# Patient Record
Sex: Male | Born: 1995 | Race: Black or African American | Hispanic: No | Marital: Single | Smoking: Current every day smoker
Health system: Southern US, Community
[De-identification: ages and names within clinical notes are randomized; demographics above are authoritative.]

---
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
## 2015-01-03 ENCOUNTER — Encounter (HOSPITAL_COMMUNITY): Payer: Self-pay | Admitting: Emergency Medicine

## 2015-01-03 ENCOUNTER — Emergency Department (INDEPENDENT_AMBULATORY_CARE_PROVIDER_SITE_OTHER)
Admission: EM | Admit: 2015-01-03 | Discharge: 2015-01-03 | Disposition: A | Discharge status: Home / Self Care | Payer: Medicaid Other | Source: Home / Self Care | Attending: Family Medicine | Admitting: Family Medicine

## 2015-01-03 DIAGNOSIS — J039 Acute tonsillitis, unspecified: Secondary | ICD-10-CM | POA: Diagnosis not present

## 2015-01-03 MED ORDER — AMOXICILLIN 500 MG PO CAPS: 500.0000 mg | ORAL_CAPSULE | Freq: Three times a day (TID) | ORAL | Status: DC

## 2015-01-03 MED ORDER — ACETAMINOPHEN 325 MG PO TABS
ORAL_TABLET | ORAL | Status: AC
  Filled 2015-01-03: qty 2

## 2015-01-03 MED ORDER — ACETAMINOPHEN 325 MG PO TABS
650.0000 mg | ORAL_TABLET | Freq: Once | ORAL | Status: AC
  Administered 2015-01-03: 650 mg via ORAL

## 2015-01-03 NOTE — ED Provider Notes (Addendum)
CSN: [id number]     Arrival date & time 01/03/15  1526 History   First MD Initiated Contact with Patient 01/03/15 1538     Chief Complaint  Patient presents with  . Sore Throat   (Consider location/radiation/quality/duration/timing/severity/associated sxs/prior Treatment) Patient is a 19 y.o. male presenting with pharyngitis. The history is provided by the patient.  Sore Throat This is a new problem. The current episode started 2 days ago. The problem has been gradually worsening. The symptoms are aggravated by swallowing.    History reviewed. No pertinent past medical history. History reviewed. No pertinent past surgical history. No family history on file. History  Substance Use Topics  . Smoking status: Current Every Day Smoker  . Smokeless tobacco: Not on file  . Alcohol Use: No    Review of Systems  Constitutional: Negative.   HENT: Positive for sore throat.   Hematological: Positive for adenopathy.    Allergies  Review of patient's allergies indicates no known allergies.  Home Medications   Prior to Admission medications   Medication Sig Start Date End Date Taking? Authorizing Provider  amoxicillin (AMOXIL) 500 MG capsule Take 1 capsule (500 mg total) by mouth 3 (three) times daily. 01/03/15   Linna HoffJames D Karna Abed, MD   BP 125/74 mmHg  Pulse 96  Temp(Src) 102.4 F (39.1 C) (Oral)  Resp 18  SpO2 99% Physical Exam  Constitutional: He is oriented to person, place, and time. He appears well-developed and well-nourished. No distress.  HENT:  Head: Normocephalic.  Right Ear: External ear normal.  Left Ear: External ear normal.  Mouth/Throat: Oropharyngeal exudate present.  Neck: Normal range of motion. Neck supple.  Lymphadenopathy:    He has cervical adenopathy.  Neurological: He is alert and oriented to person, place, and time.  Skin: Skin is warm and dry. No rash noted.  Nursing note and vitals reviewed.   ED Course  Procedures (including critical care time) Labs  Review Labs Reviewed - No data to display  Imaging Review No results found.   MDM   1. Acute tonsillitis        Linna HoffJames D Amica Harron, MD 01/03/15 1544  Linna HoffJames D Angeliah Wisdom, MD 01/03/15 [phone]

## 2015-01-03 NOTE — ED Notes (Signed)
C/o sore throat, fever, very painful to swallow. Feels like strep throat

## 2015-09-15 ENCOUNTER — Encounter (HOSPITAL_COMMUNITY): Payer: Self-pay | Admitting: *Deleted

## 2015-09-15 ENCOUNTER — Emergency Department (INDEPENDENT_AMBULATORY_CARE_PROVIDER_SITE_OTHER)
Admission: EM | Admit: 2015-09-15 | Discharge: 2015-09-15 | Disposition: A | Discharge status: Home / Self Care | Payer: Medicaid Other | Source: Home / Self Care | Attending: Emergency Medicine | Admitting: Emergency Medicine

## 2015-09-15 ENCOUNTER — Emergency Department (INDEPENDENT_AMBULATORY_CARE_PROVIDER_SITE_OTHER): Payer: Medicaid Other

## 2015-09-15 DIAGNOSIS — S60221A Contusion of right hand, initial encounter: Secondary | ICD-10-CM

## 2015-09-15 DIAGNOSIS — M79644 Pain in right finger(s): Secondary | ICD-10-CM | POA: Diagnosis not present

## 2015-09-15 MED ORDER — IBUPROFEN 800 MG PO TABS: 800.0000 mg | ORAL_TABLET | Freq: Four times a day (QID) | ORAL | Status: DC | PRN

## 2015-09-15 MED ORDER — TRAMADOL HCL 50 MG PO TABS: ORAL_TABLET | ORAL | Status: DC

## 2015-09-15 NOTE — Discharge Instructions (Signed)
Go to www.goodrx.com to look up your medications. This will give you a list of where you can find your prescriptions at the most affordable prices.   This practice is taking new patients. They will see you even if you do not have insurance.  [facility] [address] [place], [place]  [address] [phone]  If you have no primary doctor, here are some resources that may be helpful:  Medicaid-accepting [facility] Providers: - [facility]- [address]  [phone];   - [facility]- [address] [place], [address] [phone]  - [facility]- [address], [address] [phone]  [facility]- [address] [phone]  - Renaye Rakers- [address] [phone]. Only accepts [place] patients after they have her name applied to their card  -Dr. Greggory Stallion Osei-Bonsu, [facility]. [address]    [place], [place] [address]  [phone]  Self Pay (no insurance) in [place]: - Sickle Cell Patients: Dr Willey Blade, [facility] [address] [place] [phone]  - [facility] [phone]  - Physician Referral Service- [phone]  - [facility]- [address], [place], [phone];  Monday to Friday, 9 a.m. - 7 p.m.; Saturday 9 a.m. to 1 p.m.  [facility]- [address] [place], [phone]  - [facility]- [address]      [phone] - [facility]- [address] [phone]  [facility], [address] [place]; [phone]; or [address], [place]; [phone].   [facility], [place]. [address], [place]; [phone]; Monday to Wednesday, 8:30 a.m. - 5 p.m.; Thursday, 8:30 a.m. - 8 p.m.  [facility] [place] [facility], [address], [place];  [phone]; Monday to Friday, 8 a.m. - 4:30 p.m.   [facility], [place] [address] [place], [phone]; first and third Saturday of the month, 9:30 a.m. - 12:30 p.m.  [facility], [address], [place], [phone]; second Saturday of the month, 9 a.m. -noon.  [facility] for children. For information, call [phone]; [phone]; or [phone].  Other agencies that provide inexpensive medical care:     [facility]  [phone]    [facility]  [phone]    [facility]  [phone] [address] [place] [address]    [facility]  [phone]    [facility]  [phone], [phone]; or [phone].  Chronic Pain Problems Contact [facility]  [phone] Patients need to be referred by their primary care doctor.  [facility]  [facility]     [employer]                          [facility] Dept. [address] [place]                       [address]      [address] [place] [address]   [phone] (After Hours)  General Information: Finding a doctor when you do not have health insurance can be tricky. Although you are not limited by an insurance plan, you  are of course limited by her finances and how much but he can pay out of pocket.  What are your options if you don't have health insurance?   1) Find a Librarian, academic and Pay Out of Pocket Although you won't have to find out who is covered by your insurance plan, it is a good idea to ask around and get recommendations. You will then need to call the office and see if the doctor you have chosen will accept you as a new patient and what types of options they offer for patients who are self-pay. Some doctors offer discounts or will set up payment plans for their patients who do not have insurance, but you will need to ask so you aren't surprised when you get to your appointment.  2) Contact Your  Local Health Department Not all health departments have doctors that can see patients for sick visits, but many do, so it is worth a call to see if yours does. If you don't know where your local health department is, you can check in your phone book. The CDC also has a tool to help you locate your state's health department, and many state websites also have listings of all of their local health departments.  3) Find a Walk-in Clinic If your illness is not likely to be very severe or complicated, you may want to try a walk in clinic. These are popping up all over the country in pharmacies, drugstores, and shopping centers. They're usually staffed by nurse practitioners or physician assistants that have been trained to treat common illnesses and complaints. They're usually fairly quick and inexpensive. However, if you have serious medical issues or chronic medical problems, these are probably not your best option

## 2015-09-15 NOTE — ED Notes (Signed)
Reports punching a wall with right fist 2 days ago.  C/O continued pain and swelling.  Pt has been using ice pack in waiting area.

## 2015-09-15 NOTE — ED Provider Notes (Signed)
HPI  SUBJECTIVE:  Andrew Ortiz is a right handed 20 y.o. male who presents with pain, swelling at the second right MCP after he punched a wall 2 days ago. He reports pain with moving his index finger. He tried applying traction and ice today without improvement. There are no alleviating factors. Symptoms are worse with palpation, moving his finger. No weakness, redness, numbness, tingling, deformity, other injury to the hand or wrist. No previous history of injury to his hand. Patient is a smoker. Past medical history negative for diabetes, hypertension.    History reviewed. No pertinent past medical history.  History reviewed. No pertinent past surgical history.  No family history on file.  Social History  Substance Use Topics  . Smoking status: Current Every Day Smoker  . Smokeless tobacco: None  . Alcohol Use: No    No current facility-administered medications for this encounter.  Current outpatient prescriptions:  .  ibuprofen (ADVIL,MOTRIN) 800 MG tablet, Take 1 tablet (800 mg total) by mouth every 6 (six) hours as needed., Disp: 30 tablet, Rfl: 0 .  traMADol (ULTRAM) 50 MG tablet, 1-2 tabs po q 6 hr prn pain Maximum dose= 8 tablets per day, Disp: 20 tablet, Rfl: 0  No Known Allergies   ROS  As noted in HPI.   Physical Exam  BP 154/89 mmHg  Pulse 71  Temp(Src) 98.8 F (37.1 C) (Oral)  SpO2 100%  Constitutional: Well developed, well nourished, no acute distress Eyes:  EOMI, conjunctiva normal bilaterally HENT: Normocephalic, atraumatic,mucus membranes moist Respiratory: Normal inspiratory effort Cardiovascular: Normal rate GI: nondistended skin: No rash, skin intact Musculoskeletal: Tenderness, swelling at the right second MCP. No tenderness along the metacarpals. Proximal, middle, distal phalanx normal. Baseline Strength and Sensation to R hand with normal light touch intact for Pt, CR< 2 secs.  Hand with intact motor strength 5/5 flexion / extension / FDP / FDS  against resistance in all fingers and 2-point discrimination intact at 5mm in index finger. PIP, DIP stable on varus/valgus stress.. Skin intact. No signs of trauma. Wrist WNL.  Neurologic: Alert & oriented x 3, no focal neuro deficits Psychiatric: Speech and behavior appropriate   ED Course   Medications - No data to display  Orders Placed This Encounter  Procedures  . DG Hand Complete Right    Standing Status: Standing     Number of Occurrences: 1     Standing Expiration Date:     Order Specific Question:  Reason for Exam (SYMPTOM  OR DIAGNOSIS REQUIRED)    Answer:  Blunt trauma; hand pain & swelling  . Apply finger splint static    Index finger.Long finger splint place under palm.    Standing Status: Standing     Number of Occurrences: 1     Standing Expiration Date:     Order Specific Question:  Laterality    Answer:  Right  . Maintain finger splint    Standing Status: Standing     Number of Occurrences: 1     Standing Expiration Date:     Order Specific Question:  Laterality    Answer:  Right    No results found for this or any previous visit (from the past 24 hour(s)). Dg Hand Complete Right  09/15/2015  CLINICAL DATA:  Blunt trauma with hand pain and swelling. Right hand pain after punching a wall a few days prior. EXAM: RIGHT HAND - COMPLETE 3+ VIEW COMPARISON:  None. FINDINGS: No fracture or dislocation. The alignment and joint  spaces are maintained. Particularly, the metacarpals are intact. Mild dorsal soft tissue edema, no radiopaque foreign body. IMPRESSION: Dorsal soft tissue edema.  No fracture or dislocation. Electronically Signed   By: Rubye Oaks M.D.   On: 09/15/2015 19:24     ED Clinical Impression  Hand contusion, right, initial encounter  ED Assessment/Plan  Reviewed imaging independently No fracture, dislocation. See radiology report for full details.  Presentation most consistent with hand contusion/sprain. No evidence of fracture.  We'll  place patient in a finger splint for comfort, home with ibuprofen, tramadol, follow-up with primary care physician of choice. Providing primary care referral list  Encouraged smoking cessation  Discussed imaging, MDM, plan and followup with patient . Discussed sn/sx that should prompt return to the UC. Patient  agrees with plan.   *This clinic note was created using Dragon dictation software. Therefore, there may be occasional mistakes despite careful proofreading.  ?   Domenick Gong, MD 09/15/15 [phone]

## 2018-06-08 ENCOUNTER — Ambulatory Visit (HOSPITAL_COMMUNITY)
Admission: EM | Admit: 2018-06-08 | Discharge: 2018-06-08 | Disposition: A | Discharge status: Home / Self Care | Payer: Self-pay | Attending: Family Medicine | Admitting: Family Medicine

## 2018-06-08 ENCOUNTER — Encounter (HOSPITAL_COMMUNITY): Payer: Self-pay

## 2018-06-08 DIAGNOSIS — Z7952 Long term (current) use of systemic steroids: Secondary | ICD-10-CM | POA: Insufficient documentation

## 2018-06-08 DIAGNOSIS — J039 Acute tonsillitis, unspecified: Secondary | ICD-10-CM | POA: Insufficient documentation

## 2018-06-08 DIAGNOSIS — Z79899 Other long term (current) drug therapy: Secondary | ICD-10-CM | POA: Insufficient documentation

## 2018-06-08 DIAGNOSIS — K122 Cellulitis and abscess of mouth: Secondary | ICD-10-CM | POA: Insufficient documentation

## 2018-06-08 DIAGNOSIS — F17200 Nicotine dependence, unspecified, uncomplicated: Secondary | ICD-10-CM | POA: Insufficient documentation

## 2018-06-08 LAB — POCT RAPID STREP A: Streptococcus, Group A Screen (Direct): NEGATIVE

## 2018-06-08 LAB — POCT INFECTIOUS MONO SCREEN: Mono Screen: NEGATIVE

## 2018-06-08 MED ORDER — PENICILLIN G BENZATHINE 1200000 UNIT/2ML IM SUSP
INTRAMUSCULAR | Status: AC
  Filled 2018-06-08: qty 2

## 2018-06-08 MED ORDER — PREDNISONE 50 MG PO TABS: 50.0000 mg | ORAL_TABLET | Freq: Every day | ORAL | 0 refills | Status: DC

## 2018-06-08 MED ORDER — PENICILLIN G BENZATHINE 1200000 UNIT/2ML IM SUSP
1.2000 10*6.[IU] | Freq: Once | INTRAMUSCULAR | Status: AC
  Administered 2018-06-08: 1.2 10*6.[IU] via INTRAMUSCULAR

## 2018-06-08 MED ORDER — METHYLPREDNISOLONE SODIUM SUCC 125 MG IJ SOLR
80.0000 mg | Freq: Once | INTRAMUSCULAR | Status: AC
  Administered 2018-06-08: 80 mg via INTRAMUSCULAR

## 2018-06-08 MED ORDER — METHYLPREDNISOLONE SODIUM SUCC 125 MG IJ SOLR
INTRAMUSCULAR | Status: AC
  Filled 2018-06-08: qty 2

## 2018-06-08 NOTE — ED Triage Notes (Signed)
Pt presents with sore throat. 

## 2018-06-08 NOTE — Discharge Instructions (Addendum)
Rapid strep negative. However, given your exam, will cover you empirically for bacterial infection with bicillin injection. A steroid injection is given in office today as well for swelling. As discussed, symptoms can still be due to viral illness/ drainage down your throat. This usually takes 7-10 days to resolve.  Start prednisone as directed. Monitor for any worsening of symptoms, swelling of the throat, trouble breathing, trouble swallowing, leaning forward to breath, drooling, go to the emergency department for further evaluation needed.

## 2018-06-08 NOTE — ED Provider Notes (Signed)
[facility]    CSN: [id number] Arrival date & time: 06/08/18  1714     History   Chief Complaint Chief Complaint  Patient presents with  . Sore Throat    HPI Andrew Ortiz is a 22 y.o. male.   22 year old male comes in for 1 day history of sore throat.  Denies rhinorrhea, nasal congestion, cough.  Denies fever, chills, night sweats.  He has had muffled voice since this morning as well.  Can handle secretions on own, but has been spitting to prevent worsening of sore throat.  Denies tripoding, drooling, trismus.  No obvious sick contact.  Has not tried anything for the symptoms.     History reviewed. No pertinent past medical history.  There are no active problems to display for this patient.   History reviewed. No pertinent surgical history.     Home Medications    Prior to Admission medications   Medication Sig Start Date End Date Taking? Authorizing Provider  ibuprofen (ADVIL,MOTRIN) 800 MG tablet Take 1 tablet (800 mg total) by mouth every 6 (six) hours as needed. 09/15/15   Domenick GongMortenson, Ashley, MD  predniSONE (DELTASONE) 50 MG tablet Take 1 tablet (50 mg total) by mouth daily. 06/08/18   Belinda FisherYu,  V, PA-C  traMADol (ULTRAM) 50 MG tablet 1-2 tabs po q 6 hr prn pain Maximum dose= 8 tablets per day 09/15/15   Domenick GongMortenson, Ashley, MD    Family History History reviewed. No pertinent family history.  Social History Social History   Tobacco Use  . Smoking status: Current Every Day Smoker  Substance Use Topics  . Alcohol use: No  . Drug use: No     Allergies   Patient has no known allergies.   Review of Systems Review of Systems  Reason unable to perform ROS: See HPI as above.     Physical Exam Triage Vital Signs ED Triage Vitals  Enc Vitals Group     BP 06/08/18 1742 (!) 144/86     Pulse Rate 06/08/18 1742 95     Resp 06/08/18 1742 20     Temp 06/08/18 1742 99.9 F (37.7 C)     Temp Source 06/08/18 1742 Oral     SpO2 06/08/18 1742 100 %    Weight --      Height --      Head Circumference --      Peak Flow --      Pain Score 06/08/18 1743 8     Pain Loc --      Pain Edu? --      Excl. in GC? --    No data found.  Updated Vital Signs BP (!) 144/86 (BP Location: Left Arm)   Pulse 95   Temp 99.9 F (37.7 C) (Oral)   Resp 20   SpO2 100%   Visual Acuity Right Eye Distance:   Left Eye Distance:   Bilateral Distance:    Right Eye Near:   Left Eye Near:    Bilateral Near:     Physical Exam  Constitutional: He is oriented to person, place, and time. He appears well-developed and well-nourished. No distress.  HENT:  Head: Normocephalic and atraumatic.  Right Ear: Tympanic membrane, external ear and ear canal normal. Tympanic membrane is not erythematous and not bulging.  Left Ear: Tympanic membrane, external ear and ear canal normal. Tympanic membrane is not erythematous and not bulging.  Nose: Nose normal. Right sinus exhibits no maxillary sinus tenderness and no frontal sinus  tenderness. Left sinus exhibits no maxillary sinus tenderness and no frontal sinus tenderness.  Mouth/Throat: Uvula is midline, oropharynx is clear and moist and mucous membranes are normal.  Patient handling own secretions.  Muffled voice when talking. " Kissing" tonsils with exudates, equal bilaterally.  Uvula swelling.  Eyes: Pupils are equal, round, and reactive to light. Conjunctivae are normal.  Neck: Normal range of motion. Neck supple.  Cardiovascular: Normal rate, regular rhythm and normal heart sounds. Exam reveals no gallop and no friction rub.  No murmur heard. Pulmonary/Chest: Effort normal and breath sounds normal. He has no decreased breath sounds. He has no wheezes. He has no rhonchi. He has no rales.  Lymphadenopathy:    He has no cervical adenopathy.  Neurological: He is alert and oriented to person, place, and time.  Skin: Skin is warm and dry.  Psychiatric: He has a normal mood and affect. His behavior is normal. Judgment  normal.     UC Treatments / Results  Labs (all labs ordered are listed, but only abnormal results are displayed) Labs Reviewed  CULTURE, GROUP A STREP [facility])  POCT RAPID STREP A  POCT INFECTIOUS MONO SCREEN    EKG None  Radiology No results found.  Procedures Procedures (including critical care time)  Medications Ordered in UC Medications  penicillin g benzathine (BICILLIN LA) 1200000 UNIT/2ML injection 1.2 Million Units (has no administration in time range)  methylPREDNISolone sodium succinate (SOLU-MEDROL) 125 mg/2 mL injection 80 mg (has no administration in time range)    Initial Impression / Assessment and Plan / UC Course  I have reviewed the triage vital signs and the nursing notes.  Pertinent labs & imaging results that were available during my care of the patient were reviewed by me and considered in my medical decision making (see chart for details).    Rapid strep negative.  Mono negative.  Given patient's history and exam, will treat empirically for tonsillitis.  Patient prefers injection given painful swallowing.  Bicillin injection in office.  Given extensive swelling, "kissing" tonsils, will provide prednisone.  Solu-Medrol injection in office, prednisone as directed.  Strict return precautions given.  Patient expresses understanding and agrees to plan.  Case discussed with Dr. Delton See, who agrees to plan.  Final Clinical Impressions(s) / UC Diagnoses   Final diagnoses:  Tonsillitis  Uvulitis    ED Prescriptions    Medication Sig Dispense Auth. Provider   predniSONE (DELTASONE) 50 MG tablet Take 1 tablet (50 mg total) by mouth daily. 5 tablet Threasa Alpha, [place] 06/08/18 1904

## 2018-06-11 LAB — CULTURE, GROUP A STREP (THRC)

## 2020-03-09 ENCOUNTER — Other Ambulatory Visit: Payer: Self-pay

## 2020-03-09 ENCOUNTER — Ambulatory Visit (HOSPITAL_COMMUNITY): Admission: EM | Admit: 2020-03-09 | Discharge: 2020-03-09 | Discharge status: Against Medical Advice | Payer: Self-pay

## 2020-08-07 ENCOUNTER — Emergency Department (HOSPITAL_COMMUNITY)
Admission: EM | Admit: 2020-08-07 | Discharge: 2020-08-07 | Disposition: A | Discharge status: Home / Self Care | Payer: HRSA Program | Attending: Emergency Medicine | Admitting: Emergency Medicine

## 2020-08-07 ENCOUNTER — Encounter (HOSPITAL_COMMUNITY): Payer: Self-pay

## 2020-08-07 ENCOUNTER — Other Ambulatory Visit: Payer: Self-pay

## 2020-08-07 DIAGNOSIS — U071 COVID-19: Secondary | ICD-10-CM | POA: Insufficient documentation

## 2020-08-07 DIAGNOSIS — F17200 Nicotine dependence, unspecified, uncomplicated: Secondary | ICD-10-CM | POA: Diagnosis not present

## 2020-08-07 DIAGNOSIS — R509 Fever, unspecified: Secondary | ICD-10-CM | POA: Diagnosis present

## 2020-08-07 DIAGNOSIS — B349 Viral infection, unspecified: Secondary | ICD-10-CM

## 2020-08-07 MED ORDER — ACETAMINOPHEN 500 MG PO TABS
1000.0000 mg | ORAL_TABLET | Freq: Once | ORAL | Status: AC
  Administered 2020-08-07: 1000 mg via ORAL

## 2020-08-07 NOTE — Discharge Instructions (Addendum)
It was our pleasure to provide your ER care today - we hope that you feel better. ° °Your covid results are pending, and should be resulted in the next 6-12 hours - you may check MyChart, or call tomorrow, for results.  ° °Stay well hydrated and get adequate nutrition. Stay active, take full and deep breaths.  ° °Take acetaminophen or ibuprofen as need.  ° °Return to ER if worse, new symptoms, increased trouble breathing, persistent vomiting, weak/fainting, chest pain, or other concern.  °

## 2020-08-07 NOTE — ED Provider Notes (Signed)
[facility]-EMERGENCY DEPT Provider Note   CSN: [id number] Arrival date & time: 08/07/20  1529     History Chief Complaint  Patient presents with  . Generalized Body Aches  . Chills    Andrew Ortiz is a 25 y.o. male.  Patient c/o body aches, subjective fever, in the past couple days. Symptoms acute onset, moderate, constant, persistent. States has been around others w uri symptoms, unsure if specific covid exposure. Has not been vaccinated. Denies chest pain or discomfort. No sob. No abd pain or nvd. No change in taste or smell. No sore throat or trouble breathing, or swallowing. Mild intermittent frontal headache, no abrupt or severe head pain. No neck pain or stiffness. No rash.  The history is provided by the patient.       History reviewed. No pertinent past medical history.  There are no problems to display for this patient.   History reviewed. No pertinent surgical history.     History reviewed. No pertinent family history.  Social History   Tobacco Use  . Smoking status: Current Every Day Smoker  Substance Use Topics  . Alcohol use: No  . Drug use: No    Home Medications Prior to Admission medications   Medication Sig Start Date End Date Taking? Authorizing Provider  ibuprofen (ADVIL,MOTRIN) 800 MG tablet Take 1 tablet (800 mg total) by mouth every 6 (six) hours as needed. 09/15/15   Domenick Gong, MD  predniSONE (DELTASONE) 50 MG tablet Take 1 tablet (50 mg total) by mouth daily. 06/08/18   Belinda Fisher, PA-C  traMADol (ULTRAM) 50 MG tablet 1-2 tabs po q 6 hr prn pain Maximum dose= 8 tablets per day 09/15/15   Domenick Gong, MD    Allergies    Patient has no known allergies.  Review of Systems   Review of Systems  Constitutional: Positive for fever.  HENT: Negative for sore throat.   Eyes: Negative for redness.  Respiratory: Negative for cough and shortness of breath.   Cardiovascular: Negative for chest pain.   Gastrointestinal: Negative for abdominal pain, diarrhea and vomiting.  Genitourinary: Negative for dysuria and flank pain.  Musculoskeletal: Positive for myalgias. Negative for neck pain and neck stiffness.  Skin: Negative for rash.  Neurological: Positive for headaches. Negative for weakness and numbness.  Hematological: Does not bruise/bleed easily.  Psychiatric/Behavioral: Negative for confusion.    Physical Exam Updated Vital Signs BP 122/73 (BP Location: Left Arm)   Pulse 92   Temp 100.3 F (37.9 C) (Oral)   Resp 18   Ht 1.88 m (6\' 2" )   Wt 86.2 kg   SpO2 97%   BMI 24.39 kg/m   Physical Exam Vitals and nursing note reviewed.  Constitutional:      Appearance: Normal appearance. He is well-developed.  HENT:     Head: Atraumatic.     Nose: Nose normal.     Mouth/Throat:     Mouth: Mucous membranes are moist.     Pharynx: Oropharynx is clear. No oropharyngeal exudate or posterior oropharyngeal erythema.  Eyes:     General: No scleral icterus.    Conjunctiva/sclera: Conjunctivae normal.  Neck:     Trachea: No tracheal deviation.  Cardiovascular:     Rate and Rhythm: Normal rate and regular rhythm.     Pulses: Normal pulses.     Heart sounds: Normal heart sounds. No murmur heard. No friction rub. No gallop.   Pulmonary:     Effort: Pulmonary effort is normal.  No accessory muscle usage or respiratory distress.     Breath sounds: Normal breath sounds.  Abdominal:     General: There is no distension.     Palpations: Abdomen is soft.     Tenderness: There is no abdominal tenderness.  Genitourinary:    Comments: No cva tenderness. Musculoskeletal:        General: No swelling.     Cervical back: Normal range of motion and neck supple. No rigidity.     Right lower leg: No edema.     Left lower leg: No edema.  Lymphadenopathy:     Cervical: No cervical adenopathy.  Skin:    General: Skin is warm and dry.     Findings: No rash.  Neurological:     Mental Status:  He is alert.     Comments: Alert, speech clear. Motor/sens grossly intact bil. Steady gait.   Psychiatric:        Mood and Affect: Mood normal.     ED Results / Procedures / Treatments   Labs (all labs ordered are listed, but only abnormal results are displayed) Labs Reviewed  SARS CORONAVIRUS 2 (TAT 6-24 HRS)    EKG None  Radiology No results found.  Procedures Procedures (including critical care time)  Medications Ordered in ED Medications  acetaminophen (TYLENOL) tablet 1,000 mg (has no administration in time range)    ED Course  I have reviewed the triage vital signs and the nursing notes.  Pertinent labs & imaging results that were available during my care of the patient were reviewed by me and considered in my medical decision making (see chart for details).    MDM Rules/Calculators/A&P                         COvid swab sent.   Reviewed nursing notes and prior charts for additional history.   Andrew Ortiz was evaluated in Emergency Department on 08/07/2020 for the symptoms described in the history of present illness. He was evaluated in the context of the global COVID-19 pandemic, which necessitated consideration that the patient might be at risk for infection with the SARS-CoV-2 virus that causes COVID-19. Institutional protocols and algorithms that pertain to the evaluation of patients at risk for COVID-19 are in a state of rapid change based on information released by regulatory bodies including the CDC and federal and state organizations. These policies and algorithms were followed during the patient's care in the ED.  Acetaminophen po. Po fluids.  Pts symptoms/exam felt most c/w viral illness, possibly covid. Swab results pending.  Pulse ox is 97%, and pt with normal work of breathing, and non-toxic appearing - pt currently appears stable for d/c.   Return precautions provided.    Final Clinical Impression(s) / ED Diagnoses Final diagnoses:  None     Rx / DC Orders ED Discharge Orders    None       Cathren Laine, MD 08/07/20 2050

## 2020-08-07 NOTE — ED Triage Notes (Signed)
Pt presents with c/o generalized body aches and chills. Pt denies any known covid exposures.

## 2020-08-08 LAB — SARS CORONAVIRUS 2 (TAT 6-24 HRS): SARS Coronavirus 2: POSITIVE — AB

## 2020-11-24 ENCOUNTER — Emergency Department (HOSPITAL_COMMUNITY): Payer: No Typology Code available for payment source

## 2020-11-24 ENCOUNTER — Other Ambulatory Visit: Payer: Self-pay

## 2020-11-24 ENCOUNTER — Emergency Department (HOSPITAL_COMMUNITY)
Admission: EM | Admit: 2020-11-24 | Discharge: 2020-11-24 | Disposition: A | Discharge status: Home / Self Care | Payer: No Typology Code available for payment source | Attending: Emergency Medicine | Admitting: Emergency Medicine

## 2020-11-24 DIAGNOSIS — F17200 Nicotine dependence, unspecified, uncomplicated: Secondary | ICD-10-CM | POA: Diagnosis not present

## 2020-11-24 DIAGNOSIS — Y92410 Unspecified street and highway as the place of occurrence of the external cause: Secondary | ICD-10-CM | POA: Insufficient documentation

## 2020-11-24 DIAGNOSIS — M545 Low back pain, unspecified: Secondary | ICD-10-CM | POA: Diagnosis present

## 2020-11-24 MED ORDER — IBUPROFEN 800 MG PO TABS
800.0000 mg | ORAL_TABLET | Freq: Once | ORAL | Status: AC
  Administered 2020-11-24: 800 mg via ORAL
  Filled 2020-11-24: qty 1

## 2020-11-24 MED ORDER — NAPROXEN 500 MG PO TABS: 500.0000 mg | ORAL_TABLET | Freq: Two times a day (BID) | ORAL | 0 refills | Status: DC

## 2020-11-24 MED ORDER — METHOCARBAMOL 500 MG PO TABS: 500.0000 mg | ORAL_TABLET | Freq: Two times a day (BID) | ORAL | 0 refills | Status: DC
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
## 2022-05-03 ENCOUNTER — Ambulatory Visit (HOSPITAL_COMMUNITY)
Admission: EM | Admit: 2022-05-03 | Discharge: 2022-05-03 | Disposition: A | Discharge status: Home / Self Care | Payer: Self-pay | Attending: Internal Medicine | Admitting: Internal Medicine

## 2022-05-03 ENCOUNTER — Encounter (HOSPITAL_COMMUNITY): Payer: Self-pay

## 2022-05-03 DIAGNOSIS — Z8709 Personal history of other diseases of the respiratory system: Secondary | ICD-10-CM

## 2022-05-03 DIAGNOSIS — J029 Acute pharyngitis, unspecified: Secondary | ICD-10-CM

## 2022-05-03 DIAGNOSIS — J358 Other chronic diseases of tonsils and adenoids: Secondary | ICD-10-CM

## 2022-05-03 DIAGNOSIS — R509 Fever, unspecified: Secondary | ICD-10-CM

## 2022-05-03 LAB — POCT INFECTIOUS MONO SCREEN, ED / UC: Mono Screen: NEGATIVE

## 2022-05-03 LAB — POCT RAPID STREP A, ED / UC: Streptococcus, Group A Screen (Direct): NEGATIVE

## 2022-05-03 MED ORDER — AMOXICILLIN 500 MG PO CAPS: 500.0000 mg | ORAL_CAPSULE | Freq: Two times a day (BID) | ORAL | 0 refills | Status: DC

## 2022-05-03 NOTE — Discharge Instructions (Addendum)
Rapid strep throat test is negative, throat culture is pending.  The mononucleosis screen is negative today.  I am highly suspicious that you have strep throat.  Please take the amoxicillin as prescribed to treat it.  Continue salt water gargles, start Chloraseptic spray or Cepacol lozenges to help with pain.  If symptoms persist or worsen despite treatment, follow-up.

## 2022-05-03 NOTE — ED Triage Notes (Signed)
Pt is here for sore throat with painful swallowing x3days

## 2022-05-03 NOTE — ED Provider Notes (Signed)
[facility]    CSN: [id number] Arrival date & time: 05/03/22  1643      History   Chief Complaint Chief Complaint  Patient presents with   Sore Throat    HPI Andrew Ortiz is a 26 y.o. male.   Patient presents for 3 days of sore throat that has consistently worsened.  He also endorses tactile fever, body aches, sweats and chills at home.  Endorses a slight cough, headache and swollen glands on his neck.  Also reports decreased appetite because it hurts to swallow and fatigue.  Denies known sick contacts.  No chest pain, shortness of breath, chest tightness or chest congestion.  No runny nose, nasal congestion, abdominal pain, nausea/vomiting, diarrhea, or new rash.  He has tried warm salt water gargles which helps with the sore throat temporarily.    History reviewed. No pertinent past medical history.  There are no problems to display for this patient.   History reviewed. No pertinent surgical history.     Home Medications    Prior to Admission medications   Medication Sig Start Date End Date Taking? Authorizing Provider  amoxicillin (AMOXIL) 500 MG capsule Take 1 capsule (500 mg total) by mouth 2 (two) times daily. 05/03/22  Yes Eulogio Bear, NP  ibuprofen (ADVIL,MOTRIN) 800 MG tablet Take 1 tablet (800 mg total) by mouth every 6 (six) hours as needed. 09/15/15   Melynda Ripple, MD  methocarbamol (ROBAXIN) 500 MG tablet Take 1 tablet (500 mg total) by mouth 2 (two) times daily. 11/24/20   Suzy Bouchard, PA-C  naproxen (NAPROSYN) 500 MG tablet Take 1 tablet (500 mg total) by mouth 2 (two) times daily. 11/24/20   Suzy Bouchard, PA-C  predniSONE (DELTASONE) 50 MG tablet Take 1 tablet (50 mg total) by mouth daily. 06/08/18   Ok Edwards, PA-C  traMADol (ULTRAM) 50 MG tablet 1-2 tabs po q 6 hr prn pain Maximum dose= 8 tablets per day 09/15/15   Melynda Ripple, MD    Family History History reviewed. No pertinent family history.  Social  History Social History   Tobacco Use   Smoking status: Every Day  Substance Use Topics   Alcohol use: No   Drug use: No     Allergies   Patient has no known allergies.   Review of Systems Review of Systems Per HPI  Physical Exam Triage Vital Signs ED Triage Vitals [05/03/22 1703]  Enc Vitals Group     BP (!) 146/93     Pulse Rate 79     Resp 12     Temp 99.7 F (37.6 C)     Temp Source Oral     SpO2 98 %     Weight      Height      Head Circumference      Peak Flow      Pain Score 7     Pain Loc      Pain Edu?      Excl. in [place]?    No data found.  Updated Vital Signs BP (!) 146/93 (BP Location: Right Arm)   Pulse 79   Temp 99.7 F (37.6 C) (Oral)   Resp 12   SpO2 98%   Visual Acuity Right Eye Distance:   Left Eye Distance:   Bilateral Distance:    Right Eye Near:   Left Eye Near:    Bilateral Near:     Physical Exam Vitals and nursing note reviewed.  Constitutional:  General: He is not in acute distress.    Appearance: He is well-developed. He is not ill-appearing, toxic-appearing or diaphoretic.  HENT:     Head: Normocephalic and atraumatic.     Right Ear: Tympanic membrane and ear canal normal. No drainage, swelling or tenderness. No middle ear effusion. Tympanic membrane is not erythematous.     Left Ear: Tympanic membrane and ear canal normal. No drainage, swelling or tenderness.  No middle ear effusion. Tympanic membrane is not erythematous.     Nose: No congestion or rhinorrhea.     Mouth/Throat:     Mouth: Mucous membranes are dry.     Pharynx: Posterior oropharyngeal erythema present. No uvula swelling.     Tonsils: Tonsillar exudate present. No tonsillar abscesses. 3+ on the right. 3+ on the left.     Comments: Petechiae posterior palate Eyes:     Conjunctiva/sclera: Conjunctivae normal.  Cardiovascular:     Rate and Rhythm: Normal rate and regular rhythm.  Pulmonary:     Effort: Pulmonary effort is normal. No respiratory  distress.     Breath sounds: Normal breath sounds. No wheezing, rhonchi or rales.  Musculoskeletal:     Cervical back: Neck supple.  Lymphadenopathy:     Cervical: No cervical adenopathy.  Skin:    General: Skin is warm and dry.     Coloration: Skin is not pale.     Findings: No erythema or rash.  Neurological:     Mental Status: He is alert and oriented to person, place, and time.      UC Treatments / Results  Labs (all labs ordered are listed, but only abnormal results are displayed) Labs Reviewed  CULTURE, GROUP A STREP [facility])  POCT RAPID STREP A, ED / UC  POCT INFECTIOUS MONO SCREEN, ED / UC    EKG   Radiology No results found.  Procedures Procedures (including critical care time)  Medications Ordered in UC Medications - No data to display  Initial Impression / Assessment and Plan / UC Course  I have reviewed the triage vital signs and the nursing notes.  Pertinent labs & imaging results that were available during my care of the patient were reviewed by me and considered in my medical decision making (see chart for details).    Patient is well-appearing, normotensive, afebrile, not tachycardic, not tachypneic, oxygenating well on room air.  Rapid strep throat test negative, mononucleosis screen negative.  Throat culture pending.  I am highly suspicious for strep throat given examination and history of strep throat.  We will treat empirically with amoxicillin twice daily for 10 days.  Toothbrush after starting treatment.  Supportive care discussed.  ER and return precautions discussed.  Note given for work.  The patient was given the opportunity to ask questions.  All questions answered to their satisfaction.  The patient is in agreement to this plan.    Final Clinical Impressions(s) / UC Diagnoses   Final diagnoses:  Acute pharyngitis, unspecified etiology  Tonsillar exudate  History of strep pharyngitis  Fever, unspecified     Discharge Instructions       Rapid strep throat test is negative, throat culture is pending.  The mononucleosis screen is negative today.  I am highly suspicious that you have strep throat.  Please take the amoxicillin as prescribed to treat it.  Continue salt water gargles, start Chloraseptic spray or Cepacol lozenges to help with pain.  If symptoms persist or worsen despite treatment, follow-up.     ED  Prescriptions     Medication Sig Dispense Auth. Provider   amoxicillin (AMOXIL) 500 MG capsule Take 1 capsule (500 mg total) by mouth 2 (two) times daily. 20 capsule Eulogio Bear, NP      PDMP not reviewed this encounter.   Eulogio Bear, NP 05/03/22 1757

## 2022-05-06 LAB — CULTURE, GROUP A STREP (THRC)

## 2022-06-13 IMAGING — CR DG THORACIC SPINE 2V
2 series · 2 of 2 positions shown · non-contrast
Comparison: None.

CLINICAL DATA: Pain

EXAM:
THORACIC SPINE 2 VIEWS; LUMBAR SPINE - COMPLETE 4+ VIEW

[t thoracic spine ap]
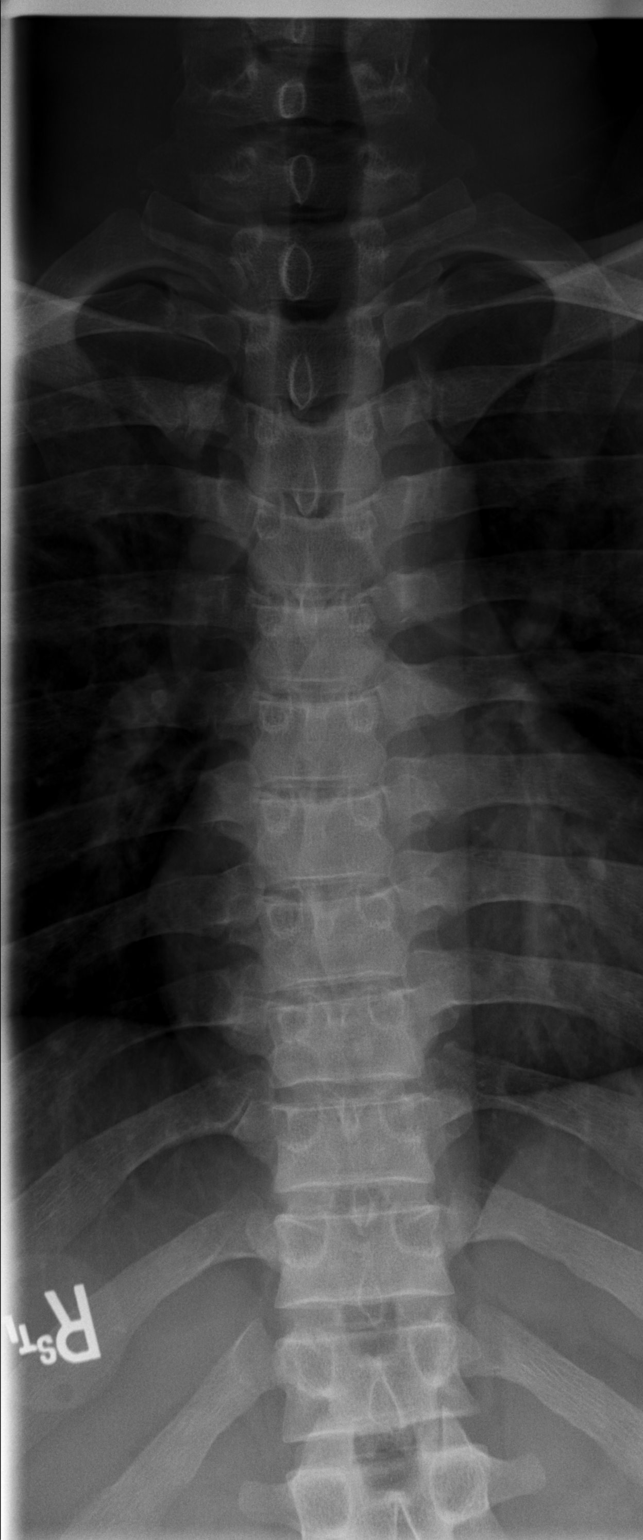

[t thoracic spine lat]
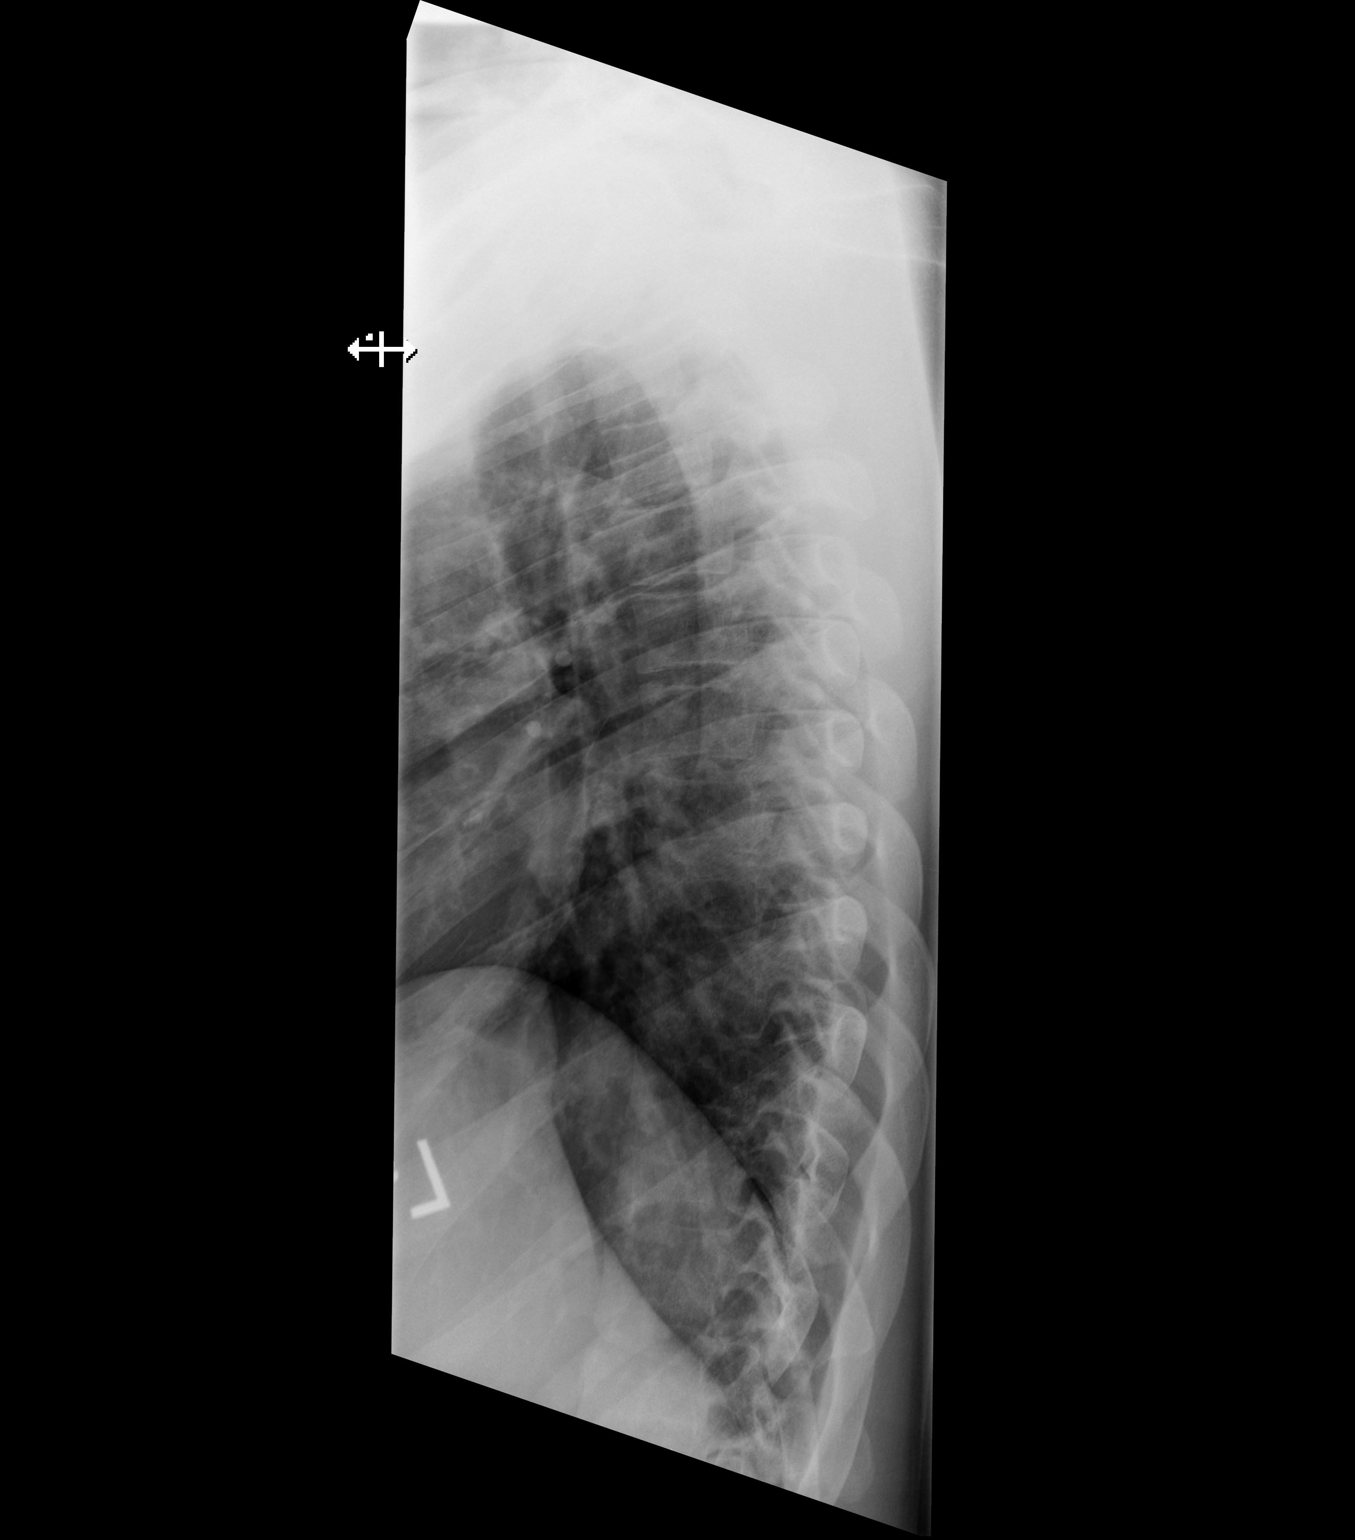

[2 of 2 positions shown; findings below may reference images not displayed]

FINDINGS: There is no acute osseous abnormality involving the lumbar spine.
There are no significant degenerative changes. There is no acute
osseous abnormality involving the thoracic spine, however evaluation
is significantly limited by suboptimal lateral view.
IMPRESSION: Negative.

## 2024-01-30 ENCOUNTER — Encounter (HOSPITAL_COMMUNITY): Payer: Self-pay

## 2024-01-30 ENCOUNTER — Ambulatory Visit (HOSPITAL_COMMUNITY)
Admission: EM | Admit: 2024-01-30 | Discharge: 2024-01-30 | Disposition: A | Discharge status: Home / Self Care | Payer: Self-pay | Attending: Internal Medicine | Admitting: Internal Medicine

## 2024-01-30 ENCOUNTER — Ambulatory Visit (INDEPENDENT_AMBULATORY_CARE_PROVIDER_SITE_OTHER): Payer: Self-pay

## 2024-01-30 ENCOUNTER — Other Ambulatory Visit: Payer: Self-pay

## 2024-01-30 DIAGNOSIS — M79675 Pain in left toe(s): Secondary | ICD-10-CM

## 2024-01-30 DIAGNOSIS — M25476 Effusion, unspecified foot: Secondary | ICD-10-CM

## 2024-01-30 MED ORDER — PREDNISONE 20 MG PO TABS: 40.0000 mg | ORAL_TABLET | Freq: Every day | ORAL | 0 refills | Status: AC

## 2024-01-30 NOTE — Discharge Instructions (Addendum)
 Your x-ray of your left great toe appears normal. I have drawn blood work today to evaluate for other causes of pain and swelling of your left great toe. Nurse will call you if your blood work is abnormal.  Take prednisone  40 mg (2 pills) once daily for the next 5 days.  Do not take any ibuprofen /naproxen  while you are taking prednisone .  Rest, ice, and elevate the left great toe for support.  Please schedule a follow-up appointment with the podiatrist (foot doctor) listed on your paperwork.  If you develop any new or worsening symptoms or if your symptoms do not start to improve, please return here or follow-up with your primary care provider. If your symptoms are severe, please go to the emergency room.

## 2024-01-30 NOTE — ED Provider Notes (Signed)
 [facility]    CSN: [id number] Arrival date & time: 01/30/24  1420      History   Chief Complaint Chief Complaint  Patient presents with   Toe Pain    HPI Andrew Ortiz is a 28 y.o. male.   Andrew Ortiz is a 28 y.o. male presenting for chief complaint of left great toe pain and swelling that started approximately 1 week ago.  Pain and swelling has worsened significantly over the last 7 days.  He works at [employer] where he is jumping and on his feet the majority of the day, he wonders if this is triggered the pain and swelling.  No recent trauma or injuries to the left great toe that he is aware of.  No numbness or tingling distally to pain/swelling, history of previous injury to the left great toe, history of gout, or recent fever/chills.  He has not noticed any rash, redness, or drainage from the toe.  Toenail is intact.  He does eat fish on a regular basis as well as red meat.  No family history of gout.  He has not attempted use of any over-the-counter medications to help with symptoms PTA.  Upon further questioning and at discharge, patient tells me that he has been forced to wear shoes that are too small for him as his boss ordered him a size 10 shoe instead of a size 11 shoe.  He has been cramping his feet into the shoes and states this has worsened his toe pain and swelling while attempting to perform his job activities.     History reviewed. No pertinent past medical history.  There are no active problems to display for this patient.   History reviewed. No pertinent surgical history.     Home Medications    Prior to Admission medications   Medication Sig Start Date End Date Taking? Authorizing Provider  predniSONE  (DELTASONE ) 20 MG tablet Take 2 tablets (40 mg total) by mouth daily with breakfast for 5 days. 01/30/24 02/04/24 Yes StanhopeDorna HERO, FNP  amoxicillin  (AMOXIL ) 500 MG capsule Take 1 capsule (500 mg total) by mouth 2 (two) times daily.  05/03/22   Chandra Harlene LABOR, NP  ibuprofen  (ADVIL ,MOTRIN ) 800 MG tablet Take 1 tablet (800 mg total) by mouth every 6 (six) hours as needed. 09/15/15   Van Knee, MD  methocarbamol  (ROBAXIN ) 500 MG tablet Take 1 tablet (500 mg total) by mouth 2 (two) times daily. 11/24/20   Aberman, Caroline C, PA-C  naproxen  (NAPROSYN ) 500 MG tablet Take 1 tablet (500 mg total) by mouth 2 (two) times daily. 11/24/20   Aberman, Caroline C, PA-C  traMADol  (ULTRAM ) 50 MG tablet 1-2 tabs po q 6 hr prn pain Maximum dose= 8 tablets per day 09/15/15   Van Knee, MD    Family History History reviewed. No pertinent family history.  Social History Social History   Tobacco Use   Smoking status: Every Day  Vaping Use   Vaping status: Never Used  Substance Use Topics   Alcohol use: No   Drug use: No     Allergies   Patient has no known allergies.   Review of Systems Review of Systems Per HPI  Physical Exam Triage Vital Signs ED Triage Vitals  Encounter Vitals Group     BP 01/30/24 1455 124/80     Girls Systolic BP Percentile --      Girls Diastolic BP Percentile --      Boys Systolic BP  Percentile --      Boys Diastolic BP Percentile --      Pulse Rate 01/30/24 1455 97     Resp 01/30/24 1455 20     Temp 01/30/24 1455 99 F (37.2 C)     Temp Source 01/30/24 1455 Oral     SpO2 01/30/24 1455 98 %     Weight --      Height --      Head Circumference --      Peak Flow --      Pain Score 01/30/24 1452 8     Pain Loc --      Pain Education --      Exclude from Growth Chart --    No data found.  Updated Vital Signs BP 124/80 (BP Location: Right Arm)   Pulse 97   Temp 99 F (37.2 C) (Oral)   Resp 20   SpO2 98%   Visual Acuity Right Eye Distance:   Left Eye Distance:   Bilateral Distance:    Right Eye Near:   Left Eye Near:    Bilateral Near:     Physical Exam Vitals and nursing note reviewed.  Constitutional:      Appearance: He is not ill-appearing or  toxic-appearing.  HENT:     Head: Normocephalic and atraumatic.     Right Ear: Hearing and external ear normal.     Left Ear: Hearing and external ear normal.     Nose: Nose normal.     Mouth/Throat:     Lips: Pink.  Eyes:     General: Lids are normal. Vision grossly intact. Gaze aligned appropriately.     Extraocular Movements: Extraocular movements intact.     Conjunctiva/sclera: Conjunctivae normal.  Pulmonary:     Effort: Pulmonary effort is normal.  Musculoskeletal:     Cervical back: Neck supple.     Right foot: Normal.     Left foot: Decreased range of motion. Normal capillary refill. Swelling, tenderness and bony tenderness present. No deformity, bunion, Charcot foot, foot drop, prominent metatarsal heads, laceration or crepitus. Normal pulse.     Comments: +2 left dorsalis pedis pulse.  Less than 2 cap refill distally to left great toe.  Sensation and strength intact distally. Tender to palpation over the left great toe DIP.  Soft tissue swelling present.  See image below.  Skin:    General: Skin is warm and dry.     Capillary Refill: Capillary refill takes less than 2 seconds.     Findings: No rash.  Neurological:     General: No focal deficit present.     Mental Status: He is alert and oriented to person, place, and time. Mental status is at baseline.     Cranial Nerves: No dysarthria or facial asymmetry.  Psychiatric:        Mood and Affect: Mood normal.        Speech: Speech normal.        Behavior: Behavior normal.        Thought Content: Thought content normal.        Judgment: Judgment normal.      UC Treatments / Results  Labs (all labs ordered are listed, but only abnormal results are displayed) Labs Reviewed - No data to display  EKG   Radiology DG Foot Complete Left Result Date: 01/30/2024 CLINICAL DATA:  Pain swelling great toe EXAM: LEFT FOOT - COMPLETE 3+ VIEW COMPARISON:  None Available. FINDINGS: There is no evidence  of fracture or dislocation.  There is no evidence of arthropathy or other focal bone abnormality. Soft tissues are unremarkable. IMPRESSION: Negative. Electronically Signed   By: Luke Bun M.D.   On: 01/30/2024 16:24    Procedures Procedures (including critical care time)  Medications Ordered in UC Medications - No data to display  Initial Impression / Assessment and Plan / UC Course  I have reviewed the triage vital signs and the nursing notes.  Pertinent labs & imaging results that were available during my care of the patient were reviewed by me and considered in my medical decision making (see chart for details).   1.  Left great toe pain, soft tissue swelling of joint of toe Left great toe x-ray is negative for acute bony abnormality.  Swelling and pain are likely result of use of ill fitting shoes and tendinitis due to overuse.  Prednisone  burst ordered.  RICE recommended.  Advised to wear supportive and properly fitting shoes.  His boss ordered him a pair of shoes that will fit his feet and they are to arrive in 2 days.  Work note given. Recommend follow-up with podiatry as needed.  Counseled patient on potential for adverse effects with medications prescribed/recommended today, strict ER and return-to-clinic precautions discussed, patient verbalized understanding.    Final Clinical Impressions(s) / UC Diagnoses   Final diagnoses:  Great toe pain, left  Soft tissue swelling of joint of toe     Discharge Instructions      Your x-ray of your left great toe appears normal. I have drawn blood work today to evaluate for other causes of pain and swelling of your left great toe. Nurse will call you if your blood work is abnormal.  Take prednisone  40 mg (2 pills) once daily for the next 5 days.  Do not take any ibuprofen /naproxen  while you are taking prednisone .  Rest, ice, and elevate the left great toe for support.  Please schedule a follow-up appointment with the podiatrist (foot doctor) listed  on your paperwork.  If you develop any new or worsening symptoms or if your symptoms do not start to improve, please return here or follow-up with your primary care provider. If your symptoms are severe, please go to the emergency room.     ED Prescriptions     Medication Sig Dispense Auth. Provider   predniSONE  (DELTASONE ) 20 MG tablet Take 2 tablets (40 mg total) by mouth daily with breakfast for 5 days. 10 tablet Enedelia Dorna HERO, FNP      PDMP not reviewed this encounter.   Enedelia Dorna HERO, [place] 01/30/24 [phone]

## 2024-01-30 NOTE — ED Triage Notes (Signed)
 Pt states that his LT big toe got swollen out of nowhere. Pt denies any injury. Pt c/o pain. Pt has not taken anything at home. Pt denies fever.

## 2024-02-05 ENCOUNTER — Emergency Department (HOSPITAL_COMMUNITY)
Admission: EM | Admit: 2024-02-05 | Discharge: 2024-02-05 | Disposition: A | Discharge status: Home / Self Care | Payer: Self-pay | Attending: Emergency Medicine | Admitting: Emergency Medicine

## 2024-02-05 ENCOUNTER — Emergency Department (HOSPITAL_COMMUNITY): Payer: Self-pay

## 2024-02-05 ENCOUNTER — Other Ambulatory Visit: Payer: Self-pay

## 2024-02-05 ENCOUNTER — Encounter (HOSPITAL_COMMUNITY): Payer: Self-pay | Admitting: Emergency Medicine

## 2024-02-05 DIAGNOSIS — L03032 Cellulitis of left toe: Secondary | ICD-10-CM | POA: Insufficient documentation

## 2024-02-05 DIAGNOSIS — L03039 Cellulitis of unspecified toe: Secondary | ICD-10-CM

## 2024-02-05 MED ORDER — SULFAMETHOXAZOLE-TRIMETHOPRIM 800-160 MG PO TABS: 1.0000 | ORAL_TABLET | Freq: Two times a day (BID) | ORAL | 0 refills | Status: AC

## 2024-02-05 NOTE — Progress Notes (Signed)
 Orthopedic Tech Progress Note Patient Details:  Andrew Ortiz 1996-03-02 [id number]  Ortho Devices Type of Ortho Device: Postop shoe/boot Ortho Device/Splint Location: LLE Ortho Device/Splint Interventions: Ordered, Application   Post Interventions Patient Tolerated: Well Instructions Provided: Adjustment of device  Dorcas Melito A [place] Mallon 02/05/2024, 7:45 AM

## 2024-02-05 NOTE — ED Triage Notes (Signed)
 Patient c/o left great toe pain and swelling x 2 weeks.  Patient seen at [facility] for same and was told it was because he was wearing too small shoes.  Patient states that he's started having pus drain from his nail bed recently.

## 2024-02-05 NOTE — ED Provider Notes (Signed)
 [place] [facility] Provider Note   CSN: [id number] Arrival date & time: 02/05/24  0447     Patient presents with: Toe Pain   Andrew Ortiz is a 28 y.o. male.   28 year old male presents with complaint of pain in his left great toe x 2 weeks. Patient went to UC, given prednisone  without improvement. Reports boots at work are too small. No injuries.        Prior to Admission medications   Medication Sig Start Date End Date Taking? Authorizing Provider  sulfamethoxazole -trimethoprim  (BACTRIM  DS) 800-160 MG tablet Take 1 tablet by mouth 2 (two) times daily for 7 days. 02/05/24 02/12/24 Yes Beverley Leita LABOR, PA-C    Allergies: Patient has no known allergies.    Review of Systems Negative except as per HPI Updated Vital Signs BP 127/69   Pulse 98   Temp 98.9 F (37.2 C)   Resp 14   Wt 88.5 kg   SpO2 98%   BMI 25.04 kg/m   Physical Exam Vitals and nursing note reviewed.  Constitutional:      General: He is not in acute distress.    Appearance: He is well-developed. He is not diaphoretic.  HENT:     Head: Normocephalic and atraumatic.  Pulmonary:     Effort: Pulmonary effort is normal.  Musculoskeletal:        General: Swelling and tenderness present.     Comments: Swelling and tenderness to the left great toe with paronychia to lateral aspect of toe nail, no active drainage.   Skin:    General: Skin is warm and dry.  Neurological:     Mental Status: He is alert and oriented to person, place, and time.  Psychiatric:        Behavior: Behavior normal.     (all labs ordered are listed, but only abnormal results are displayed) Labs Reviewed - No data to display  EKG: None  Radiology: DG Toe Great Left Result Date: 02/05/2024 CLINICAL DATA:  Pain and swelling. EXAM: LEFT GREAT TOE COMPARISON:  None Available. FINDINGS: No fracture. No subluxation or dislocation. No worrisome lytic or sclerotic osseous abnormality. No overt bony  demineralization or destruction to suggest osteomyelitis. IMPRESSION: Negative. Electronically Signed   By: Camellia Candle M.D.   On: 02/05/2024 05:59     .Incision and Drainage  Date/Time: 02/05/2024 6:15 AM  Performed by: Beverley Leita LABOR, PA-C Authorized by: Beverley Leita LABOR, PA-C   Consent:    Consent obtained:  Verbal   Consent given by:  Patient   Risks, benefits, and alternatives were discussed: yes     Risks discussed:  Bleeding, incomplete drainage, pain and infection   Alternatives discussed:  No treatment Universal protocol:    Patient identity confirmed:  Verbally with patient Location:    Type:  Abscess (paronychia)   Location:  Lower extremity   Lower extremity location:  Toe   Toe location:  L big toe Pre-procedure details:    Skin preparation:  Povidone-iodine Sedation:    Sedation type:  None Anesthesia:    Anesthesia method:  None Procedure type:    Complexity:  Simple Procedure details:    Incision types:  Stab incision   Drainage:  Purulent   Drainage amount:  Moderate   Wound treatment:  Wound left open   Packing materials:  None Post-procedure details:    Procedure completion:  Tolerated well, no immediate complications    Medications Ordered in the  ED - No data to display                                  Medical Decision Making Amount and/or Complexity of Data Reviewed Radiology: ordered.   28 year old male with left great toe pain, paronychia on exam. XR as ordered and interpreted by myself as negative for bony abnormality, agree with radiology interpretation. Treated with I&D. Provided with post op shoe. Recommend abx, warm epsom salt compress. Recheck with workers comp in 2 days.      Final diagnoses:  Paronychia of great toe    ED Discharge Orders          Ordered    sulfamethoxazole -trimethoprim  (BACTRIM  DS) 800-160 MG tablet  2 times daily        02/05/24 0607               Beverley Leita LABOR, PA-C 02/05/24 [id number]     Mesner, Selinda, MD 02/16/24 0145

## 2024-02-05 NOTE — Discharge Instructions (Signed)
 Warm epsom salt compresses for 20 minutes at a time. Antibiotics as prescribed. Recheck with your workers comp provider in 2 days.
